# Patient Record
Sex: Female | Born: 1935 | Race: White | Hispanic: No
Health system: Southern US, Community
[De-identification: ages and names within clinical notes are randomized; demographics above are authoritative.]

## PROBLEM LIST (undated history)

## (undated) DIAGNOSIS — G309 Alzheimer's disease, unspecified: Secondary | ICD-10-CM

## (undated) DIAGNOSIS — F0280 Dementia in other diseases classified elsewhere without behavioral disturbance: Secondary | ICD-10-CM

## (undated) DIAGNOSIS — I10 Essential (primary) hypertension: Secondary | ICD-10-CM

---

## 2004-08-20 ENCOUNTER — Emergency Department: Payer: Self-pay | Admitting: Unknown Physician Specialty

## 2005-11-07 ENCOUNTER — Emergency Department: Payer: Self-pay | Admitting: Emergency Medicine

## 2005-11-07 ENCOUNTER — Other Ambulatory Visit: Payer: Self-pay

## 2007-07-23 ENCOUNTER — Emergency Department: Payer: Self-pay | Admitting: Emergency Medicine

## 2007-07-23 ENCOUNTER — Other Ambulatory Visit: Payer: Self-pay

## 2012-01-10 ENCOUNTER — Emergency Department: Payer: Self-pay

## 2013-07-05 ENCOUNTER — Emergency Department: Payer: Self-pay | Admitting: Emergency Medicine

## 2013-07-05 LAB — URINALYSIS, COMPLETE
Bilirubin,UR: NEGATIVE
Nitrite: POSITIVE
Ph: 6 (ref 4.5–8.0)
Specific Gravity: 1.021 (ref 1.003–1.030)
Squamous Epithelial: <1
WBC UR: 219 /HPF (ref 0–5)

## 2013-07-07 LAB — URINE CULTURE

## 2013-11-17 ENCOUNTER — Emergency Department: Payer: Self-pay | Admitting: Emergency Medicine

## 2015-02-13 ENCOUNTER — Emergency Department: Payer: Medicare PPO

## 2015-02-13 ENCOUNTER — Encounter: Payer: Self-pay | Admitting: Emergency Medicine

## 2015-02-13 ENCOUNTER — Emergency Department
Admission: EM | Admit: 2015-02-13 | Discharge: 2015-02-14 | Disposition: A | Discharge status: Home / Self Care | Payer: Medicare PPO | Attending: Emergency Medicine | Admitting: Emergency Medicine

## 2015-02-13 DIAGNOSIS — I10 Essential (primary) hypertension: Secondary | ICD-10-CM | POA: Diagnosis not present

## 2015-02-13 DIAGNOSIS — Z880 Allergy status to penicillin: Secondary | ICD-10-CM | POA: Diagnosis not present

## 2015-02-13 DIAGNOSIS — W19XXXA Unspecified fall, initial encounter: Secondary | ICD-10-CM | POA: Insufficient documentation

## 2015-02-13 DIAGNOSIS — S0083XA Contusion of other part of head, initial encounter: Secondary | ICD-10-CM | POA: Insufficient documentation

## 2015-02-13 DIAGNOSIS — Y998 Other external cause status: Secondary | ICD-10-CM | POA: Insufficient documentation

## 2015-02-13 DIAGNOSIS — N390 Urinary tract infection, site not specified: Secondary | ICD-10-CM | POA: Insufficient documentation

## 2015-02-13 DIAGNOSIS — Y92129 Unspecified place in nursing home as the place of occurrence of the external cause: Secondary | ICD-10-CM | POA: Diagnosis not present

## 2015-02-13 DIAGNOSIS — Z79899 Other long term (current) drug therapy: Secondary | ICD-10-CM | POA: Diagnosis not present

## 2015-02-13 DIAGNOSIS — Y9389 Activity, other specified: Secondary | ICD-10-CM | POA: Insufficient documentation

## 2015-02-13 DIAGNOSIS — S0990XA Unspecified injury of head, initial encounter: Secondary | ICD-10-CM | POA: Diagnosis present

## 2015-02-13 HISTORY — DX: Alzheimer's disease, unspecified: G30.9

## 2015-02-13 HISTORY — DX: Dementia in other diseases classified elsewhere, unspecified severity, without behavioral disturbance, psychotic disturbance, mood disturbance, and anxiety: F02.80

## 2015-02-13 HISTORY — DX: Essential (primary) hypertension: I10

## 2015-02-13 LAB — URINALYSIS COMPLETE WITH MICROSCOPIC (ARMC ONLY)
Bilirubin Urine: NEGATIVE
Glucose, UA: NEGATIVE mg/dL
HGB URINE DIPSTICK: NEGATIVE
Ketones, ur: NEGATIVE mg/dL
NITRITE: POSITIVE — AB
PROTEIN: NEGATIVE mg/dL
SPECIFIC GRAVITY, URINE: 1.016 (ref 1.005–1.030)
pH: 6 (ref 5.0–8.0)

## 2015-02-13 LAB — BASIC METABOLIC PANEL
Anion gap: 5 (ref 5–15)
BUN: 22 mg/dL — ABNORMAL HIGH (ref 6–20)
CO2: 29 mmol/L (ref 22–32)
Calcium: 8.5 mg/dL — ABNORMAL LOW (ref 8.9–10.3)
Chloride: 108 mmol/L (ref 101–111)
Creatinine, Ser: 0.98 mg/dL (ref 0.44–1.00)
GFR, EST AFRICAN AMERICAN: 56 mL/min — AB (ref >60)
GFR, EST NON AFRICAN AMERICAN: 49 mL/min — AB (ref >60)
Glucose, Bld: 97 mg/dL (ref 65–99)
POTASSIUM: 3.3 mmol/L — AB (ref 3.5–5.1)
SODIUM: 142 mmol/L (ref 135–145)

## 2015-02-13 LAB — CBC
HEMATOCRIT: 40 % (ref 35.0–47.0)
HEMOGLOBIN: 13.3 g/dL (ref 12.0–16.0)
MCH: 32.4 pg (ref 26.0–34.0)
MCHC: 33.3 g/dL (ref 32.0–36.0)
MCV: 97.2 fL (ref 80.0–100.0)
Platelets: 209 10*3/uL (ref 150–440)
RBC: 4.11 MIL/uL (ref 3.80–5.20)
RDW: 13.2 % (ref 11.5–14.5)
WBC: 8.1 10*3/uL (ref 3.6–11.0)

## 2015-02-13 LAB — TROPONIN I

## 2015-02-13 MED ORDER — SULFAMETHOXAZOLE-TRIMETHOPRIM 800-160 MG PO TABS: 1.0000 | ORAL_TABLET | Freq: Two times a day (BID) | ORAL | Status: AC

## 2015-02-13 NOTE — ED Notes (Addendum)
Patient brought in by ems from [facility]. Patient was found on the floor. Patient with hematoma above right eye. Laceration to bridge of nose with bleeding controlled.abrasion above right eye. Patient with redness to left knee. Patient with a history of dementia, nonverbal. Per nursing home patient at [facility].

## 2015-02-13 NOTE — Discharge Instructions (Signed)
Contusion A contusion is a deep bruise. Contusions are the result of an injury that caused bleeding under the skin. The contusion may turn blue, purple, or yellow. Minor injuries will give you a painless contusion, but more severe contusions may stay painful and swollen for a few weeks.  CAUSES  A contusion is usually caused by a blow, trauma, or direct force to an area of the body. SYMPTOMS   Swelling and redness of the injured area.  Bruising of the injured area.  Tenderness and soreness of the injured area.  Pain. DIAGNOSIS  The diagnosis can be made by taking a history and physical exam. An X-ray, CT scan, or MRI may be needed to determine if there were any associated injuries, such as fractures. TREATMENT  Specific treatment will depend on what area of the body was injured. In general, the best treatment for a contusion is resting, icing, elevating, and applying cold compresses to the injured area. Over-the-counter medicines may also be recommended for pain control. Ask your caregiver what the best treatment is for your contusion. HOME CARE INSTRUCTIONS   Put ice on the injured area.  Put ice in a plastic bag.  Place a towel between your skin and the bag.  Leave the ice on for 15-20 minutes, 3-4 times a day, or as directed by your health care provider.  Only take over-the-counter or prescription medicines for pain, discomfort, or fever as directed by your caregiver. Your caregiver may recommend avoiding anti-inflammatory medicines (aspirin, ibuprofen, and naproxen) for 48 hours because these medicines may increase bruising.  Rest the injured area.  If possible, elevate the injured area to reduce swelling. SEEK IMMEDIATE MEDICAL CARE IF:   You have increased bruising or swelling.  You have pain that is getting worse.  Your swelling or pain is not relieved with medicines. MAKE SURE YOU:   Understand these instructions.  Will watch your condition.  Will get help right  away if you are not doing well or get worse. Document Released: 04/26/2005 Document Revised: 07/22/2013 Document Reviewed: 05/22/2011 [facility] Patient Information 2015 [place], [place]. This information is not intended to replace advice given to you by your health care provider. Make sure you discuss any questions you have with your health care provider.  Head Injury You have received a head injury. It does not appear serious at this time. Headaches and vomiting are common following head injury. It should be easy to awaken from sleeping. Sometimes it is necessary for you to stay in the emergency department for a while for observation. Sometimes admission to the hospital may be needed. After injuries such as yours, most problems occur within the first 24 hours, but side effects may occur up to 7-10 days after the injury. It is important for you to carefully monitor your condition and contact your health care provider or seek immediate medical care if there is a change in your condition. WHAT ARE THE TYPES OF HEAD INJURIES? Head injuries can be as minor as a bump. Some head injuries can be more severe. More severe head injuries include:  A jarring injury to the brain (concussion).  A bruise of the brain (contusion). This mean there is bleeding in the brain that can cause swelling.  A cracked skull (skull fracture).  Bleeding in the brain that collects, clots, and forms a bump (hematoma). WHAT CAUSES A HEAD INJURY? A serious head injury is most likely to happen to someone who is in a car wreck and is not wearing  a seat belt. Other causes of major head injuries include bicycle or motorcycle accidents, sports injuries, and falls. HOW ARE HEAD INJURIES DIAGNOSED? A complete history of the event leading to the injury and your current symptoms will be helpful in diagnosing head injuries. Many times, pictures of the brain, such as CT or MRI are needed to see the extent of the injury. Often, an overnight  hospital stay is necessary for observation.  WHEN SHOULD I SEEK IMMEDIATE MEDICAL CARE?  You should get help right away if:  You have confusion or drowsiness.  You feel sick to your stomach (nauseous) or have continued, forceful vomiting.  You have dizziness or unsteadiness that is getting worse.  You have severe, continued headaches not relieved by medicine. Only take over-the-counter or prescription medicines for pain, fever, or discomfort as directed by your health care provider.  You do not have normal function of the arms or legs or are unable to walk.  You notice changes in the black spots in the center of the colored part of your eye (pupil).  You have a clear or bloody fluid coming from your nose or ears.  You have a loss of vision. During the next 24 hours after the injury, you must stay with someone who can watch you for the warning signs. This person should contact local emergency services (911 in the [place] if you have seizures, you become unconscious, or you are unable to wake up. HOW CAN I PREVENT A HEAD INJURY IN THE FUTURE? The most important factor for preventing major head injuries is avoiding motor vehicle accidents. To minimize the potential for damage to your head, it is crucial to wear seat belts while riding in motor vehicles. Wearing helmets while bike riding and playing collision sports (like football) is also helpful. Also, avoiding dangerous activities around the house will further help reduce your risk of head injury.  WHEN CAN I RETURN TO NORMAL ACTIVITIES AND ATHLETICS? You should be reevaluated by your health care provider before returning to these activities. If you have any of the following symptoms, you should not return to activities or contact sports until 1 week after the symptoms have stopped:  Persistent headache.  Dizziness or vertigo.  Poor attention and concentration.  Confusion.  Memory problems.  Nausea or vomiting.  Fatigue or tire  easily.  Irritability.  Intolerant of bright lights or loud noises.  Anxiety or depression.  Disturbed sleep. MAKE SURE YOU:   Understand these instructions.  Will watch your condition.  Will get help right away if you are not doing well or get worse. Document Released: 07/17/2005 Document Revised: 07/22/2013 Document Reviewed: 03/24/2013 [facility] Patient Information 2015 [place]. This information is not intended to replace advice given to you by your health care provider. Make sure you discuss any questions you have with your health care provider.  Urinary Tract Infection Urinary tract infections (UTIs) can develop anywhere along your urinary tract. Your urinary tract is your body's drainage system for removing wastes and extra water. Your urinary tract includes two kidneys, two ureters, a bladder, and a urethra. Your kidneys are a pair of bean-shaped organs. Each kidney is about the size of your fist. They are located below your ribs, one on each side of your spine. CAUSES Infections are caused by microbes, which are microscopic organisms, including fungi, viruses, and bacteria. These organisms are so small that they can only be seen through a microscope. Bacteria are the microbes that most commonly cause UTIs.  SYMPTOMS  Symptoms of UTIs may vary by age and gender of the patient and by the location of the infection. Symptoms in young women typically include a frequent and intense urge to urinate and a painful, burning feeling in the bladder or urethra during urination. Older women and men are more likely to be tired, shaky, and weak and have muscle aches and abdominal pain. A fever may mean the infection is in your kidneys. Other symptoms of a kidney infection include pain in your back or sides below the ribs, nausea, and vomiting. DIAGNOSIS To diagnose a UTI, your caregiver will ask you about your symptoms. Your caregiver also will ask to provide a urine sample. The urine sample  will be tested for bacteria and white blood cells. White blood cells are made by your body to help fight infection. TREATMENT  Typically, UTIs can be treated with medication. Because most UTIs are caused by a bacterial infection, they usually can be treated with the use of antibiotics. The choice of antibiotic and length of treatment depend on your symptoms and the type of bacteria causing your infection. HOME CARE INSTRUCTIONS  If you were prescribed antibiotics, take them exactly as your caregiver instructs you. Finish the medication even if you feel better after you have only taken some of the medication.  Drink enough water and fluids to keep your urine clear or pale yellow.  Avoid caffeine, tea, and carbonated beverages. They tend to irritate your bladder.  Empty your bladder often. Avoid holding urine for long periods of time.  Empty your bladder before and after sexual intercourse.  After a bowel movement, women should cleanse from front to back. Use each tissue only once. SEEK MEDICAL CARE IF:   You have back pain.  You develop a fever.  Your symptoms do not begin to resolve within 3 days. SEEK IMMEDIATE MEDICAL CARE IF:   You have severe back pain or lower abdominal pain.  You develop chills.  You have nausea or vomiting.  You have continued burning or discomfort with urination. MAKE SURE YOU:   Understand these instructions.  Will watch your condition.  Will get help right away if you are not doing well or get worse. Document Released: 04/26/2005 Document Revised: 01/16/2012 Document Reviewed: 08/25/2011 [facility] Patient Information 2015 [place], [place]. This information is not intended to replace advice given to you by your health care provider. Make sure you discuss any questions you have with your health care provider.

## 2015-02-13 NOTE — ED Provider Notes (Signed)
[facility] Emergency Department Provider Note  ____________________________________________  Time seen: 2:00 AM  I have reviewed the triage vital signs and the nursing notes.   History and physical exam limited secondary to dementia  HISTORY  Chief Complaint Fall      HPI Shelby Baker is a 81 y.o. female presents with history per EMS of being found beside her bed at the nursing facility. Patient hadn't presumably and unwitnessed fall resulting in left facial/forehead trauma. Per EMS nursing home staff stated that they thought the patient rolled out of bed. Patient unable to give any history secondary to dementia     Past Medical History  Diagnosis Date  . Hypertension   . Alzheimer disease     There are no active problems to display for this patient.   History reviewed. No pertinent past surgical history.  Current Outpatient Rx  Name  Route  Sig  Dispense  Refill  . azelastine (ASTELIN) 0.1 % nasal spray   Each Nare   Place 2 sprays into both nostrils every morning. Use in each nostril as directed         . cetirizine (ZYRTEC) 10 MG tablet   Oral   Take 10 mg by mouth daily.         Marland Kitchen donepezil (ARICEPT) 10 MG tablet   Oral   Take 10 mg by mouth at bedtime.         . furosemide (LASIX) 20 MG tablet   Oral   Take 10 mg by mouth daily.         Marland Kitchen HYDROcodone-acetaminophen (NORCO/VICODIN) 5-325 MG per tablet   Oral   Take 0.5 tablets by mouth every morning.         . nystatin (MYCOSTATIN/NYSTOP) 100000 UNIT/GM POWD   Topical   Apply 100,000 Bottles topically 2 (two) times daily.         Marland Kitchen senna-docusate (SENOKOT-S) 8.6-50 MG per tablet   Oral   Take 1 tablet by mouth 2 (two) times daily.           Allergies Ciprofloxacin; Demerol; Levaquin; Noroxin; Penicillins; Prednisolone; Seldane; and Theo-dur  No family history on file.  Social History History  Substance Use Topics  . Smoking status: Unknown If  Ever Smoked  . Smokeless tobacco: Not on file  . Alcohol Use: No    Review of Systems  Constitutional: Negative for fever. Eyes: Negative for visual changes. ENT: Negative for sore throat. Cardiovascular: Negative for chest pain. Respiratory: Negative for shortness of breath. Gastrointestinal: Negative for abdominal pain, vomiting and diarrhea. Genitourinary: Negative for dysuria. Musculoskeletal: Negative for back pain. Skin: Negative for rash. Neurological: Negative for headaches, focal weakness or numbness.   10-point ROS otherwise negative.  ____________________________________________   PHYSICAL EXAM:  VITAL SIGNS: ED Triage Vitals  Enc Vitals Group     BP 02/13/15 0200 181/96 mmHg     Pulse Rate 02/13/15 0207 63     Resp 02/13/15 0200 18     Temp 02/13/15 0200 97.8 F (36.6 C)     Temp Source 02/13/15 0200 Oral     SpO2 02/13/15 0207 96 %     Weight --      Height 02/13/15 0200  (1.676 m)     Head Cir --      Peak Flow --      Pain Score --      Pain Loc --      Pain Edu? --  Excl. in GC? --      Constitutional: Alert and oriented. Well appearing and in no distress. Eyes: Conjunctivae are normal. PERRL. Normal extraocular movements. ENT   Head: Normocephalic and atraumatic.   Nose: No congestion/rhinnorhea.   Mouth/Throat: Mucous membranes are moist.   Neck: No stridor. Hematological/Lymphatic/Immunilogical: No cervical lymphadenopathy. Cardiovascular: Normal rate, regular rhythm. Normal and symmetric distal pulses are present in all extremities. No murmurs, rubs, or gallops. Respiratory: Normal respiratory effort without tachypnea nor retractions. Breath sounds are clear and equal bilaterally. No wheezes/rales/rhonchi. Gastrointestinal: Soft and nontender. No distention. There is no CVA tenderness. Genitourinary: deferred Musculoskeletal: Nontender with normal range of motion in all extremities. No joint effusions.  No lower  extremity tenderness nor edema. Neurologic:  Normal speech and language. No gross focal neurologic deficits are appreciated. Speech is normal.  Skin:  Skin is warm, dry and intact. No rash noted. Psychiatric: Mood and affect are normal. Speech and behavior are normal. Patient exhibits appropriate insight and judgment.  ____________________________________________    LABS (pertinent positives/negatives) Labs Reviewed  BASIC METABOLIC PANEL - Abnormal; Notable for the following:    Potassium 3.3 (*)    BUN 22 (*)    Calcium 8.5 (*)    GFR calc non Af Amer 49 (*)    GFR calc Af Amer 56 (*)    All other components within normal limits  URINALYSIS COMPLETEWITH MICROSCOPIC ([facility] ONLY) - Abnormal; Notable for the following:    Color, Urine YELLOW (*)    APPearance HAZY (*)    Nitrite POSITIVE (*)    Leukocytes, UA 1+ (*)    Bacteria, UA MANY (*)    Squamous Epithelial / LPF 0-5 (*)    All other components within normal limits  CBC  TROPONIN I     ____________________________________________   EKG    ____________________________________________    RADIOLOGY   IMPRESSION: CT HEAD: Small frontal scalp hematoma and laceration. No skull fracture.  No acute intracranial process.  Severe global brain atrophy. Moderate to severe white matter changes compatible with chronic small vessel ischemic disease.  CT CERVICAL SPINE: Straightened cervical lordosis without acute fracture nor malalignment.   Electronically Signed By: Awilda Metroourtnay Bloomer M.D. On: 02/13/2015 03:54           INITIAL IMPRESSION / ASSESSMENT AND PLAN / ED COURSE  Pertinent labs & imaging results that were available during my care of the patient were reviewed by me and considered in my medical decision making (see chart for details).    ____________________________________________   FINAL CLINICAL IMPRESSION(S) / ED DIAGNOSES  Final diagnoses:  Forehead contusion, initial encounter   UTI (lower urinary tract infection)      Darci Currentandolph N Desira Alessandrini, MD 02/16/15 [id number] [id number]

## 2015-05-22 ENCOUNTER — Emergency Department: Payer: Medicare PPO

## 2015-05-22 ENCOUNTER — Emergency Department
Admission: EM | Admit: 2015-05-22 | Discharge: 2015-05-22 | Disposition: A | Discharge status: Home / Self Care | Payer: Medicare PPO | Attending: Emergency Medicine | Admitting: Emergency Medicine

## 2015-05-22 ENCOUNTER — Encounter: Payer: Self-pay | Admitting: Emergency Medicine

## 2015-05-22 DIAGNOSIS — N390 Urinary tract infection, site not specified: Secondary | ICD-10-CM

## 2015-05-22 DIAGNOSIS — Z880 Allergy status to penicillin: Secondary | ICD-10-CM | POA: Diagnosis not present

## 2015-05-22 DIAGNOSIS — I10 Essential (primary) hypertension: Secondary | ICD-10-CM | POA: Insufficient documentation

## 2015-05-22 DIAGNOSIS — R29810 Facial weakness: Secondary | ICD-10-CM | POA: Insufficient documentation

## 2015-05-22 DIAGNOSIS — F0390 Unspecified dementia without behavioral disturbance: Secondary | ICD-10-CM | POA: Diagnosis not present

## 2015-05-22 DIAGNOSIS — Z79899 Other long term (current) drug therapy: Secondary | ICD-10-CM | POA: Insufficient documentation

## 2015-05-22 DIAGNOSIS — R4182 Altered mental status, unspecified: Secondary | ICD-10-CM | POA: Diagnosis present

## 2015-05-22 DIAGNOSIS — R531 Weakness: Secondary | ICD-10-CM

## 2015-05-22 LAB — URINALYSIS COMPLETE WITH MICROSCOPIC (ARMC ONLY)
Bilirubin Urine: NEGATIVE
Glucose, UA: NEGATIVE mg/dL
KETONES UR: NEGATIVE mg/dL
NITRITE: POSITIVE — AB
Protein, ur: NEGATIVE mg/dL
SPECIFIC GRAVITY, URINE: 1.02 (ref 1.005–1.030)
Trans Epithel, UA: 3
pH: 5 (ref 5.0–8.0)

## 2015-05-22 LAB — BASIC METABOLIC PANEL
Anion gap: 8 (ref 5–15)
BUN: 23 mg/dL — AB (ref 6–20)
CHLORIDE: 109 mmol/L (ref 101–111)
CO2: 28 mmol/L (ref 22–32)
Calcium: 9.2 mg/dL (ref 8.9–10.3)
Creatinine, Ser: 0.85 mg/dL (ref 0.44–1.00)
GFR calc Af Amer: >60 mL/min (ref >60)
GFR calc non Af Amer: 58 mL/min — ABNORMAL LOW (ref >60)
GLUCOSE: 89 mg/dL (ref 65–99)
POTASSIUM: 4.3 mmol/L (ref 3.5–5.1)
Sodium: 145 mmol/L (ref 135–145)

## 2015-05-22 LAB — CBC WITH DIFFERENTIAL/PLATELET
Basophils Absolute: 0.2 10*3/uL — ABNORMAL HIGH (ref 0–0.1)
Basophils Relative: 2 %
Eosinophils Absolute: 0.2 10*3/uL (ref 0–0.7)
Eosinophils Relative: 2 %
HCT: 45.2 % (ref 35.0–47.0)
HEMOGLOBIN: 14.9 g/dL (ref 12.0–16.0)
LYMPHS ABS: 3.9 10*3/uL — AB (ref 1.0–3.6)
LYMPHS PCT: 39 %
MCH: 31.9 pg (ref 26.0–34.0)
MCHC: 32.9 g/dL (ref 32.0–36.0)
MCV: 96.9 fL (ref 80.0–100.0)
Monocytes Absolute: 0.7 10*3/uL (ref 0.2–0.9)
Monocytes Relative: 7 %
NEUTROS ABS: 5 10*3/uL (ref 1.4–6.5)
NEUTROS PCT: 50 %
Platelets: 225 10*3/uL (ref 150–440)
RBC: 4.66 MIL/uL (ref 3.80–5.20)
RDW: 13.6 % (ref 11.5–14.5)
WBC: 10 10*3/uL (ref 3.6–11.0)

## 2015-05-22 LAB — PROTIME-INR
INR: 0.99
PROTHROMBIN TIME: 13.3 s (ref 11.4–15.0)

## 2015-05-22 LAB — TROPONIN I: Troponin I: <0.03 ng/mL (ref <0.031)

## 2015-05-22 MED ORDER — CEPHALEXIN 500 MG PO CAPS: 500.0000 mg | ORAL_CAPSULE | Freq: Four times a day (QID) | ORAL | Status: AC

## 2015-05-22 MED ORDER — DEXTROSE 5 % IV SOLN
1.0000 g | Freq: Once | INTRAVENOUS | Status: AC
  Administered 2015-05-22: 1 g via INTRAVENOUS
  Filled 2015-05-22: qty 10

## 2015-05-22 MED ORDER — CEPHALEXIN 500 MG PO CAPS: 500.0000 mg | ORAL_CAPSULE | Freq: Four times a day (QID) | ORAL | Status: DC

## 2015-05-22 NOTE — ED Notes (Signed)
Pt's brief noted to be soiled at this time. NAD noted. Pt rolled to opposite side, pt cleaned up and new brief put on. Explained delay to pt's daughter. No change in pt status at this time.

## 2015-05-22 NOTE — ED Notes (Signed)
[facility] c/o AMS.  Per EMS pt altered since this morning, pt has hx of TIA and HTN.  Vitals for EMS 170/89, HR 60, 95% RA.  Dr. Alphonzo LemmingsMcshane spoke with facility pt baseline nonverbal.  Pt arrived with wound to lateral left heal.  Pt nonverbal, skin warm and dry, and in NAD at this time.

## 2015-05-22 NOTE — ED Notes (Signed)
NAD noted at this time. Pt being taken back [facility] via EMS. Report called to Marchelle FolksAmanda, RN at residential facility.

## 2015-05-22 NOTE — ED Notes (Signed)
EMS arrives to take patient home. NAD noted at this time. Pt calm and cooperative at this time.

## 2015-05-22 NOTE — ED Notes (Signed)
NAD noted at this time. Pt resting in bed with NAD noted. Pt's daughter at bedside. Pt repositioned at this time.

## 2015-05-22 NOTE — ED Provider Notes (Addendum)
[facility] Emergency Department Provider Note  ____________________________________________   I have reviewed the triage vital signs and the nursing notes.   HISTORY History is limited by patient baseline mental status history is from EMS and phone call to [place] at the [place] [facility]  place Chief Complaint Altered Mental Status    HPI Shelby Baker is a 81 y.o. female who is at baseline nonverbal and nonambulatory. She has had TIAs in the past. This morning, it seemed as if when she woke up the staff noted that her right face seemed somewhat more droopy than the left. Otherwise, according to staff there, she was in her normal mental state. No recent falls or illness. They contacted their doctor and sent her to the emergency department. Patient herself cannot give a history.Apparently sometimes at baseline and she will smile at caretakers but otherwise is not interactive according to staff  Past Medical History  Diagnosis Date  . Hypertension   . Alzheimer disease     There are no active problems to display for this patient.   History reviewed. No pertinent past surgical history.  Current Outpatient Rx  Name  Route  Sig  Dispense  Refill  . azelastine (ASTELIN) 0.1 % nasal spray   Each Nare   Place 2 sprays into both nostrils every morning. Use in each nostril as directed         . cetirizine (ZYRTEC) 10 MG tablet   Oral   Take 10 mg by mouth daily.         Marland Kitchen donepezil (ARICEPT) 10 MG tablet   Oral   Take 10 mg by mouth at bedtime.         . furosemide (LASIX) 20 MG tablet   Oral   Take 10 mg by mouth daily.         Marland Kitchen HYDROcodone-acetaminophen (NORCO/VICODIN) 5-325 MG per tablet   Oral   Take 0.5 tablets by mouth every morning.         . nystatin (MYCOSTATIN/NYSTOP) 100000 UNIT/GM POWD   Topical   Apply 100,000 Bottles topically 2 (two) times daily.         Marland Kitchen senna-docusate (SENOKOT-S) 8.6-50 MG per tablet   Oral    Take 1 tablet by mouth 2 (two) times daily.           Allergies Ciprofloxacin; Demerol; Levaquin; Noroxin; Penicillins; Prednisolone; Quinine derivatives; Seldane; and Theo-dur  History reviewed. No pertinent family history.  Social History Social History  Substance Use Topics  . Smoking status: Unknown If Ever Smoked  . Smokeless tobacco: None  . Alcohol Use: No    Review of Systems Cannot obtain second patient mental status  ____________________________________________   PHYSICAL EXAM:  VITAL SIGNS: ED Triage Vitals  Enc Vitals Group     BP 05/22/15 1032 167/102 mmHg     Pulse Rate 05/22/15 1032 69     Resp --      Temp --      Temp src --      SpO2 --      Weight 05/22/15 1032 127 lb 12.8 oz (57.97 kg)     Height 05/22/15 1032  (1.575 m)     Head Cir --      Peak Flow --      Pain Score --      Pain Loc --      Pain Edu? --      Excl. in GC? --  Constitutional: Alert elderly woman in no acute distress Eyes: Conjunctivae are normal. PERRL. EOMI. Head: Atraumatic. Nose: No congestion/rhinnorhea. Mouth/Throat: Mucous membranes are moist.  Oropharynx non-erythematous. Neck: No stridor.   Nontender with no meningismus Cardiovascular: Normal rate, regular rhythm. Grossly normal heart sounds.  Good peripheral circulation. Respiratory: Normal respiratory effort.  No retractions. Lungs CTAB. Gastrointestinal: Soft and nontender. No distention. No guarding no rebound Back:  There is no focal tenderness or step off there is no midline tenderness there are no lesions noted. there is no CVA tenderness Musculoskeletal: No lower extremity tenderness. No joint effusions, no DVT signs strong distal pulses no edema patient is somewhat contracted keeps knees bent, no obvious injury or swelling Neurologic: Very difficult to fully appreciate a neurologic exam on the patient as she does not comply. There could be a slight right-sided droop however there is no other  obvious focal lesion noted she will wiggle her toes when I touch her with a pen down there, she seems to move both arms. Again very limited exam  Skin:  Skin is warm, dry and there appears to be an old well healing wound to the left heel with no evidence of infection. No rash noted.   ____________________________________________   LABS (all labs ordered are listed, but only abnormal results are displayed)  Labs Reviewed  CBC WITH DIFFERENTIAL/PLATELET  BASIC METABOLIC PANEL  PROTIME-INR  URINALYSIS COMPLETEWITH MICROSCOPIC ([facility] ONLY)  TROPONIN I   ____________________________________________  EKG  I have personally interpreted EKG, rate 70 bpm, right bundle-branch block, irregular beat. P waves are noted, we'll repeat EKG ____________________________________________  RADIOLOGY   ____________________________________________   PROCEDURES  Procedure(s) performed: None  Critical Care performed: None  ____________________________________________   INITIAL IMPRESSION / ASSESSMENT AND PLAN / ED COURSE  Pertinent labs & imaging results that were available during my care of the patient were reviewed by me and considered in my medical decision making (see chart for details).  Unfortunately elderly woman with very minimal baseline functionality in terms of inability to talk or walk presents today with a questionable right-sided facial droop. I've never seen her before, so they could be interpreted as a slight droop on the right. I cannot get her to give me a smile or anything else to really confirm that. We will obtain a CT scan and check basic labs. Patient is at her baseline otherwise to the extent can determine. Patient is not a candidate for TPA, time of onset of symptoms unknown and neurologic exams to unreliable ____________________________________________ ----------------------------------------- 12:50 PM on 05/22/2015 -----------------------------------------  Family  are in the room, they state that they do feel that the right that her face looks slightly different than the left compared to baseline. However, they do not wish to be admitted the hospital. Patient is DO NOT RESUSCITATE. There've prefer not to have her admitted. We did discuss the patient's urinary tract infection, the fact that her sensitivity suggested that these antibiotics should cover her but there is no guarantee that it well and that therefore there is a risk that she could get sicker if she goes home. Daughter understands all this but would still prefer to discharge the patient if possible. She does not feel hospital stays in the patient's best interest and at age 81 it is easy to see why.  We will therefore perform a bedside swallow study to make sure the patient is safe for discharge from aspiration point review and will reassess.   ----------------------------------------- 1:35 PM on 05/22/2015 -----------------------------------------  The patient has no evidence of progressive CVA, again neurologic exams are somewhat limited due to this patient presents to patient. I have done a bedside swallow study the patient has no difficulty swallowing. Given that the family would prefer not to have the patient admitted, she is 60 and suffers from significant dementia and able to speak and unable to walk, and do not think is unreasonable for them to prefer to go home at this point. We will treat the urinary tract infection with IV antibiotics here and by mouth at home, the last culture does show sensitivity. Repeat culture has been obtained.  ----------------------------------------- 1:48 PM on 05/22/2015 -----------------------------------------  D/w nursing home, they feel comfortable bringing the patient back home. Again, no evidence of acute CVA there is some questionable weakness on the right side of the face, otherwise, she is moving all fours. Again is a very limited workup. Family would prefer  no further intervention at this time and we'll discharge her.   FINAL CLINICAL IMPRESSION(S) / ED DIAGNOSES  Final diagnoses:  Weakness     Jeanmarie Plant, MD 05/22/15 1102  Jeanmarie Plant, MD 05/22/15 1107  Jeanmarie Plant, MD 05/22/15 1108  Jeanmarie Plant, MD 05/22/15 1251  Jeanmarie Plant, MD 05/22/15 1336  Jeanmarie Plant, MD 05/22/15 [phone]

## 2015-05-22 NOTE — Discharge Instructions (Signed)
At this time, there does appear to be a urinary tract infection. As we discussed, there is also possibility she had a small stroke. You would prefer to go home at this time which is not unreasonable. However, if there is new or worsening symptoms including numbness or weakness difficulty swallowing or she appears to be otherwise ill including fever, or weakness or low blood pressure please return to the emergency department.

## 2015-05-22 NOTE — ED Notes (Signed)
Pt able to tolerate PO fluids through a straw. Pt maintains control of her saliva, at this time, no drooling or coughing noted. Pt unable to follow commands due to advanced Alzheimer's disease, however maintains good control of her head at this time and is able to lift her head and look around. Pt given water through straw, no coughing, gurgling, or difficulty breathing noted at this time.

## 2015-05-23 LAB — URINE CULTURE

## 2015-08-01 DEATH — deceased

## 2016-11-09 IMAGING — CT CT CERVICAL SPINE W/O CM
4 of 6 series · 12 of 27 positions shown, 14 images · non-contrast
Comparison: CT head and cervical spine November 17, 2013

CLINICAL DATA: Found down on floor, RIGHT periorbital hematoma,
nasal laceration. Dementia, nonverbal. History of Alzheimer's
disease and hypertension.

EXAM:
CT HEAD WITHOUT CONTRAST
CT CERVICAL SPINE WITHOUT CONTRAST
TECHNIQUE: Multidetector CT imaging of the head and cervical spine was
performed following the standard protocol without intravenous
contrast. Multiplanar CT image reconstructions of the cervical spine
were also generated.

[Series 2: head bone · axial · 0.42mm/px · z∈[+1164,+1220]mm · 2 of 85 slices shown]
[im 29/85  bone]
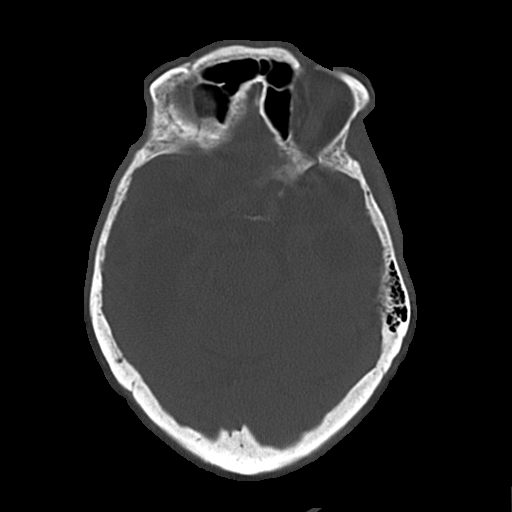
[im 57/85  bone]
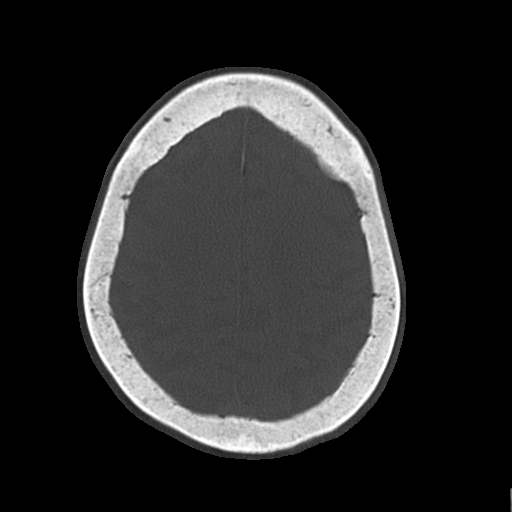

[Series 6: head bone recons · axial · 0.42mm/px · z∈[+1211,+1254]mm · 2 of 73 slices shown]
[im 25/73  bone]
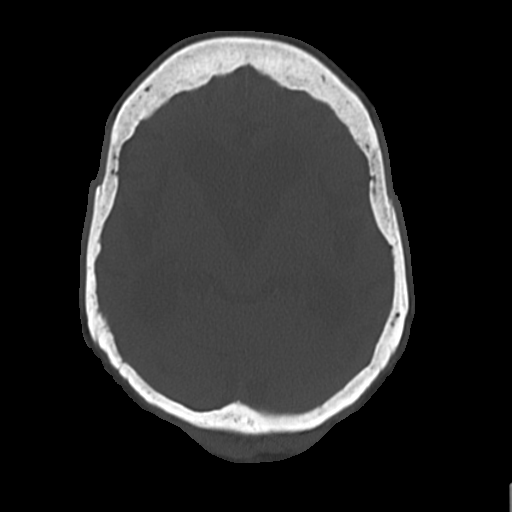
[im 49/73  bone]
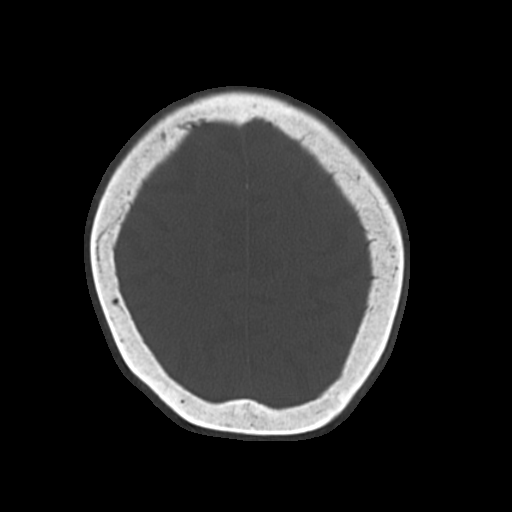

[Series 7: c spine soft · axial · 0.48mm/px · z∈[+1020,+1150]mm · 3 of 66 slices shown, 4 images]
[im 1/66  soft-tissue]
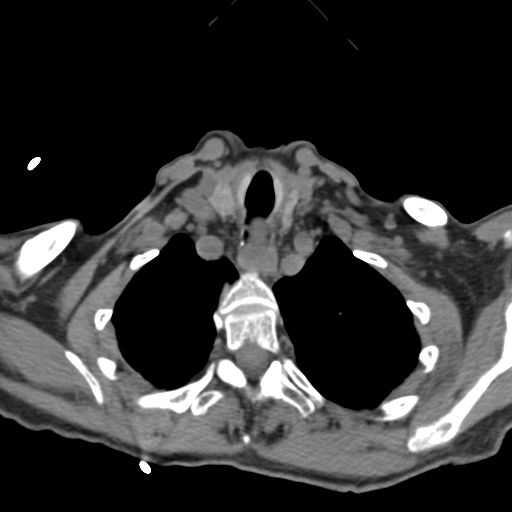
[im 1/66  bone]
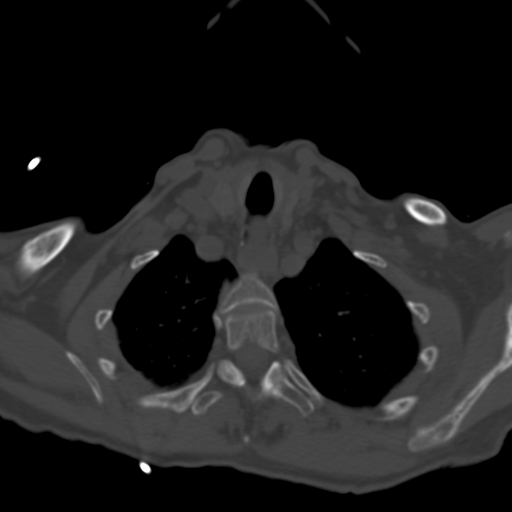
[im 33/66  bone]
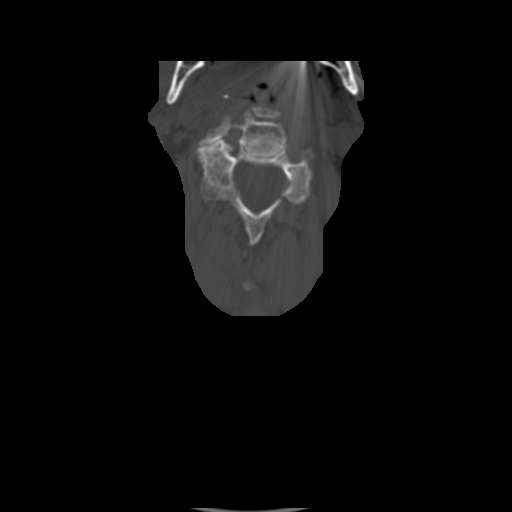
[im 66/66  bone]
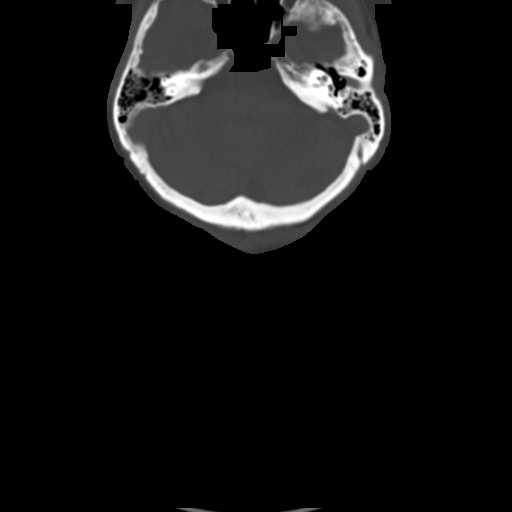

[Series 8: sag bone · sagittal · 0.39mm/px · 5 of 43 slices shown, 6 images]
[im 15/43  bone]
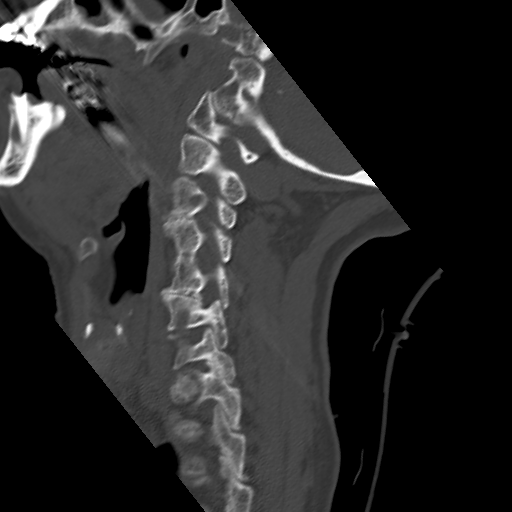
[im 18/43  bone]
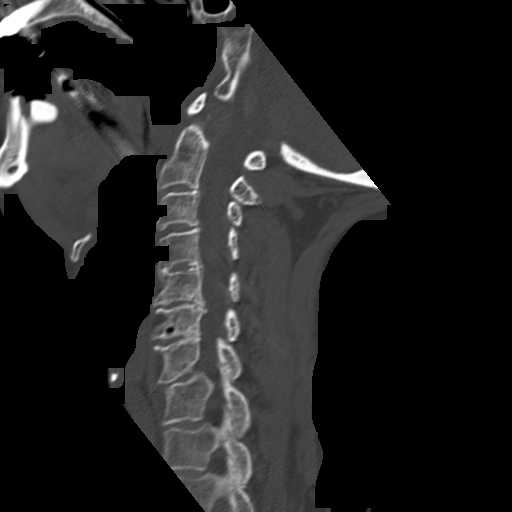
[im 22/43  soft-tissue]
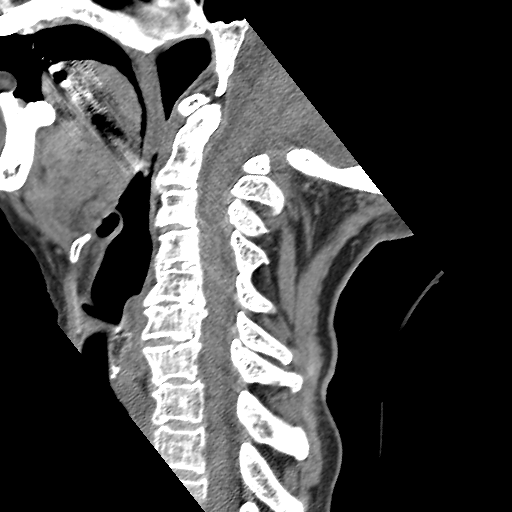
[im 22/43  bone]
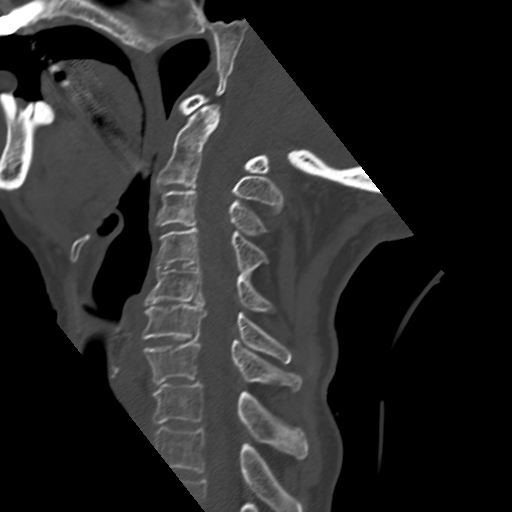
[im 25/43  bone]
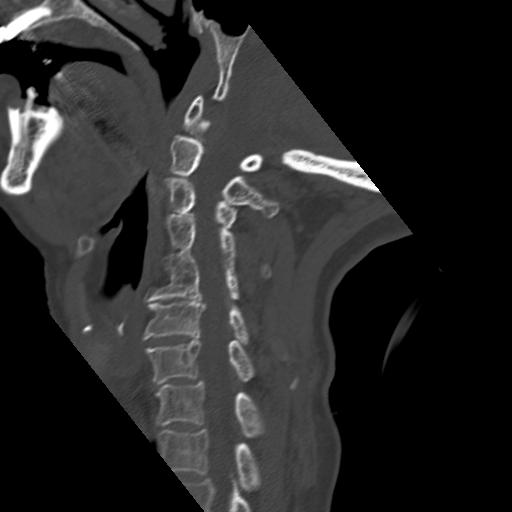
[im 29/43  bone]
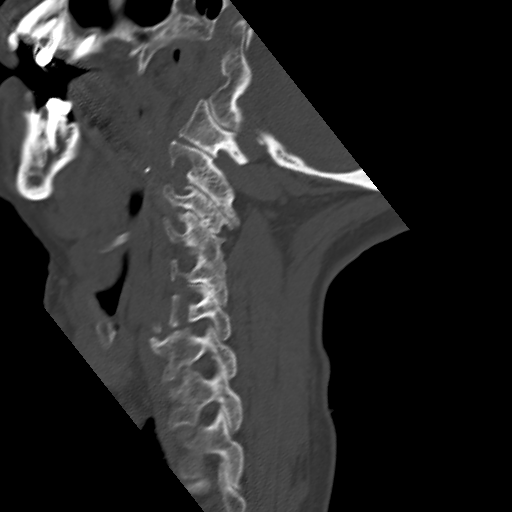

[12 of 27 positions shown; findings below may reference images not displayed]

FINDINGS: CT HEAD FINDINGS

Severe ventriculomegaly, with proportional sulcal enlargement,
cortical atrophy. Patchy to confluent supratentorial white matter
hypodensities are similar. No midline shift, mass effect nor acute
large vascular territory infarcts. No intraparenchymal hemorrhage.

No abnormal extra-axial fluid collections. Moderate calcific
atherosclerosis the carotid siphons.

Ocular globes and orbital contents are unremarkable. Partially
imaged LEFT maxillary periapical lucency/ abscess with mild LEFT
maxillary sinusitis, mild ethmoid mucosal thickening. The mastoid
air cells are well aerated. Soft tissue within the LEFT external
auditory canal likely represents cerumen. Mild bifrontal scalp
hematoma, subcutaneous gas without radiopaque foreign bodies. No
skull fracture. Moderate to severe LEFT temporomandibular
osteoarthrosis.

CT CERVICAL SPINE FINDINGS

Cervical vertebral bodies and posterior elements intact and aligned,
straightened cervical lordosis. RIGHT C4-5 facets fused on
degenerative basis. Moderate to severe C5-6 disc height loss, mild
at C4-5, C6-7 with uncovertebral hypertrophy, ventral endplate
spurring. Severe RIGHT upper cervical facet arthropathy, moderate on
the LEFT. C1-2 articulation maintained without destructive bony
lesions. Partially imaged multiple dental caries. Mild calcific
atherosclerosis of the carotid bulbs.
IMPRESSION: CT HEAD: Small frontal scalp hematoma and laceration. No skull
fracture.

No acute intracranial process.

Severe global brain atrophy. Moderate to severe white matter changes
compatible with chronic small vessel ischemic disease.

CT CERVICAL SPINE: Straightened cervical lordosis without acute
fracture nor malalignment.
